# Patient Record
Sex: Male | Born: 1985 | Race: Black or African American | Hispanic: No | Marital: Single | Smoking: Current every day smoker
Health system: Southern US, Community
[De-identification: ages and names within clinical notes are randomized; demographics above are authoritative.]

---

## 2013-12-04 ENCOUNTER — Emergency Department: Payer: Self-pay | Admitting: Emergency Medicine
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
## 2016-07-09 ENCOUNTER — Emergency Department
Admission: EM | Admit: 2016-07-09 | Discharge: 2016-07-09 | Disposition: A | Discharge status: Home / Self Care | Payer: Self-pay | Attending: Student in an Organized Health Care Education/Training Program | Admitting: Student in an Organized Health Care Education/Training Program

## 2016-07-09 DIAGNOSIS — Y939 Activity, unspecified: Secondary | ICD-10-CM | POA: Insufficient documentation

## 2016-07-09 DIAGNOSIS — S025XXA Fracture of tooth (traumatic), initial encounter for closed fracture: Secondary | ICD-10-CM | POA: Insufficient documentation

## 2016-07-09 DIAGNOSIS — Y929 Unspecified place or not applicable: Secondary | ICD-10-CM | POA: Insufficient documentation

## 2016-07-09 DIAGNOSIS — X58XXXA Exposure to other specified factors, initial encounter: Secondary | ICD-10-CM | POA: Insufficient documentation

## 2016-07-09 DIAGNOSIS — Y999 Unspecified external cause status: Secondary | ICD-10-CM | POA: Insufficient documentation

## 2016-07-09 DIAGNOSIS — F17200 Nicotine dependence, unspecified, uncomplicated: Secondary | ICD-10-CM | POA: Insufficient documentation

## 2016-07-09 MED ORDER — TRAMADOL HCL 50 MG PO TABS: 50.0000 mg | ORAL_TABLET | Freq: Four times a day (QID) | ORAL | 0 refills | Status: DC | PRN

## 2016-07-09 NOTE — ED Notes (Signed)
Pt ambulatory to room, talking in complete sentences. States lower R dental pain x 2 days. Has not seen dentist, doesn't have dentist.

## 2016-07-09 NOTE — Discharge Instructions (Signed)
Advised to follow-up with the walk-in dental clinic tomorrow morning.

## 2016-07-09 NOTE — ED Provider Notes (Signed)
[facility] Emergency Department Provider Note   ____________________________________________   First MD Initiated Contact with Patient 07/09/16 1634     (approximate)  I have reviewed the triage vital signs and the nursing notes.   HISTORY  Chief Complaint Dental Pain    HPI Matthew Elliott is a 31 y.o. male  Patient complaining of dental pain secondary to fractured tooth. Patient rates his pain as a 10 over 10. Patient describes the pain as "sharp". No palliative measures taken for complaint.   History reviewed. No pertinent past medical history.  There are no active problems to display for this patient.   History reviewed. No pertinent surgical history.  Prior to Admission medications   Medication Sig Start Date End Date Taking? Authorizing Provider  traMADol (ULTRAM) 50 MG tablet Take 1 tablet (50 mg total) by mouth every 6 (six) hours as needed for moderate pain. 07/09/16   Joni ReiningSmith, Brenyn Petrey K, PA-C    Allergies Patient has no known allergies.  History reviewed. No pertinent family history.  Social History Social History  Substance Use Topics  . Smoking status: Current Every Day Smoker  . Smokeless tobacco: Not on file  . Alcohol use Yes    Review of Systems  Constitutional: No fever/chills Eyes: No visual changes. ENT: No sore throat. Cardiovascular: Denies chest pain. Respiratory: Denies shortness of breath. Gastrointestinal: No abdominal pain.  No nausea, no vomiting.  No diarrhea.  No constipation. Genitourinary: Negative for dysuria. Musculoskeletal: Negative for back pain. Skin: Negative for rash. Neurological: Negative for headaches, focal weakness or numbness.   ____________________________________________   PHYSICAL EXAM:  VITAL SIGNS: ED Triage Vitals  Enc Vitals Group     BP 07/09/16 1631 92/76     Pulse Rate 07/09/16 1631 (!) 58     Resp 07/09/16 1631 18     Temp 07/09/16 1631 97.7 F (36.5 C)     Temp  Source 07/09/16 1631 Oral     SpO2 07/09/16 1631 100 %     Weight 07/09/16 1632 135 lb (61.2 kg)     Height 07/09/16 1632 5\' 8"  (1.727 m)     Head Circumference --      Peak Flow --      Pain Score 07/09/16 1632 10     Pain Loc --      Pain Edu? --      Excl. in [place]? --     Constitutional: Alert and oriented. Well appearing and in no acute distress. Head: Atraumatic. Nose: No congestion/rhinnorhea. Mouth/Throat: Mucous membranes are moist.  Oropharynx non-erythematous. Fractured tooth #29 Neck: No stridor.  No cervical spine tenderness to palpation. Hematological/Lymphatic/Immunilogical: No cervical lymphadenopathy. Cardiovascular: Normal rate, regular rhythm. Grossly normal heart sounds.  Good peripheral circulation. Respiratory: Normal respiratory effort.  No retractions. Lungs CTAB. Neurologic:  Normal speech and language. No gross focal neurologic deficits are appreciated. No gait instability. Skin:  Skin is warm, dry and intact. No rash noted. Psychiatric: Mood and affect are normal. Speech and behavior are normal.  ____________________________________________   LABS (all labs ordered are listed, but only abnormal results are displayed)  Labs Reviewed - No data to display ____________________________________________  EKG   ____________________________________________  RADIOLOGY  No results found.  ____________________________________________   PROCEDURES  Procedure(s) performed: None  Procedures  Critical Care performed: No  ____________________________________________   INITIAL IMPRESSION / ASSESSMENT AND PLAN / ED COURSE  Pertinent labs & imaging results that were available during my care of the  patient were reviewed by me and considered in my medical decision making (see chart for details).   dental pain secondary to fractured tooth. Patient given discharge care instructions. Patient advised of the walk-in dental clinic tomorrow morning.       ____________________________________________   FINAL CLINICAL IMPRESSION(S) / ED DIAGNOSES  Final diagnoses:  Closed fracture of tooth, initial encounter      NEW MEDICATIONS STARTED DURING THIS VISIT:  New Prescriptions   TRAMADOL (ULTRAM) 50 MG TABLET    Take 1 tablet (50 mg total) by mouth every 6 (six) hours as needed for moderate pain.     Note:  This document was prepared using Dragon voice recognition software and may include unintentional dictation errors.    Joni Reining, PA-C 07/09/16 1652    Willy Eddy, MD 07/09/16 2102

## 2018-11-01 ENCOUNTER — Other Ambulatory Visit: Payer: Self-pay

## 2018-11-01 ENCOUNTER — Emergency Department
Admission: EM | Admit: 2018-11-01 | Discharge: 2018-11-01 | Disposition: A | Discharge status: Home / Self Care | Payer: Self-pay | Attending: Emergency Medicine | Admitting: Emergency Medicine

## 2018-11-01 ENCOUNTER — Encounter: Payer: Self-pay | Admitting: Emergency Medicine

## 2018-11-01 DIAGNOSIS — S025XXA Fracture of tooth (traumatic), initial encounter for closed fracture: Secondary | ICD-10-CM | POA: Insufficient documentation

## 2018-11-01 DIAGNOSIS — F17210 Nicotine dependence, cigarettes, uncomplicated: Secondary | ICD-10-CM | POA: Insufficient documentation

## 2018-11-01 DIAGNOSIS — Y929 Unspecified place or not applicable: Secondary | ICD-10-CM | POA: Insufficient documentation

## 2018-11-01 DIAGNOSIS — X58XXXA Exposure to other specified factors, initial encounter: Secondary | ICD-10-CM | POA: Insufficient documentation

## 2018-11-01 DIAGNOSIS — Y939 Activity, unspecified: Secondary | ICD-10-CM | POA: Insufficient documentation

## 2018-11-01 DIAGNOSIS — L03211 Cellulitis of face: Secondary | ICD-10-CM | POA: Insufficient documentation

## 2018-11-01 DIAGNOSIS — Y998 Other external cause status: Secondary | ICD-10-CM | POA: Insufficient documentation

## 2018-11-01 MED ORDER — PREDNISONE 20 MG PO TABS
60.0000 mg | ORAL_TABLET | ORAL | Status: AC
  Administered 2018-11-01: 60 mg via ORAL
  Filled 2018-11-01: qty 3

## 2018-11-01 MED ORDER — PREDNISONE 10 MG PO TABS: ORAL_TABLET | ORAL | 0 refills | Status: AC

## 2018-11-01 MED ORDER — IBUPROFEN 600 MG PO TABS
600.0000 mg | ORAL_TABLET | Freq: Once | ORAL | Status: AC
  Administered 2018-11-01: 600 mg via ORAL
  Filled 2018-11-01: qty 1

## 2018-11-01 MED ORDER — PREDNISONE 10 MG PO TABS: ORAL_TABLET | ORAL | 0 refills | Status: DC

## 2018-11-01 MED ORDER — CLINDAMYCIN HCL 150 MG PO CAPS: 450.0000 mg | ORAL_CAPSULE | Freq: Three times a day (TID) | ORAL | 0 refills | Status: AC

## 2018-11-01 MED ORDER — CLINDAMYCIN HCL 150 MG PO CAPS
450.0000 mg | ORAL_CAPSULE | Freq: Once | ORAL | Status: AC
  Administered 2018-11-01: 450 mg via ORAL
  Filled 2018-11-01: qty 3

## 2018-11-01 MED ORDER — HYDROCODONE-ACETAMINOPHEN 5-325 MG PO TABS: 2.0000 | ORAL_TABLET | Freq: Four times a day (QID) | ORAL | 0 refills | Status: AC | PRN

## 2018-11-01 NOTE — ED Triage Notes (Signed)
Pt says he's had a broken tooth on the top right side of his mouth for a few months; started noticing some swelling to the cheek area 2 days ago; pt in no acute distress

## 2018-11-01 NOTE — ED Notes (Signed)
ED Provider at bedside. 

## 2018-11-01 NOTE — Discharge Instructions (Addendum)
As we discussed, it appears that your dental problems have led to an infection in the left side of your face called facial cellulitis.  It is very important that you take the full course of treatment prescribed including of the antibiotics and the steroids.  The antibiotics will help treat the infection and the steroids will help with the swelling and inflammation.  Take over-the-counter ibuprofen and Tylenol according to label instructions, or try taking ibuprofen 603 times a day with meals.  The steroids and the ibuprofen together can cause some stomach problems, so I encourage you to take it with food, and you may want to consider taking an over-the-counter medication called omeprazole (Prilosec) to help protect your stomach  while you are on the medications.  Your pharmacist can point you in the right direction.  Please call the dentist of your choice at the next available opportunity to schedule a follow-up appointment.  Additionally, for the swelling in your face, I provided the name and number of an ENT (ear, nose, and throat) specialist.  You can call and schedule follow-up appointment with him as well.    Return to the emergency department if you develop new or worsening symptoms that concern you.

## 2018-11-01 NOTE — ED Provider Notes (Signed)
[facility] Emergency Department Provider Note  ____________________________________________   First MD Initiated Contact with Patient 11/01/18 0155     (approximate)  I have reviewed the triage vital signs and the nursing notes.   HISTORY  Chief Complaint Dental Pain    HPI Matthew Elliott is a 33 y.o. male with prior history of broken tooth in the upper left side of his mouth who presents for evaluation of worsening pain over the last 2 to 3 days with swelling in the left side of his face.  He says that he has had a broken tooth for a while and has not had much trouble with it but is become more painful to eat and drink and now the left side of his face is swollen.  It is tender to the touch.  He denies any other symptoms including fever/chills, sore throat, difficulty swallowing, chest pain, shortness of breath, nausea, vomiting, and abdominal pain.  It is not difficult for him to speak or tolerate his own secretions.  The pain is anywhere from mild to severe at times it hurts worse when eating or drinking especially anything cold or hot.  He has no dentist at this time.         History reviewed. No pertinent past medical history.  There are no active problems to display for this patient.   History reviewed. No pertinent surgical history.  Prior to Admission medications   Medication Sig Start Date End Date Taking? Authorizing Provider  clindamycin (CLEOCIN) 150 MG capsule Take 3 capsules (450 mg total) by mouth 3 (three) times daily for 10 days. 11/01/18 11/11/18  Loleta RoseForbach, Tu Shimmel, MD  HYDROcodone-acetaminophen (NORCO/VICODIN) 5-325 MG tablet Take 2 tablets by mouth every 6 (six) hours as needed for moderate pain or severe pain. 11/01/18   Loleta RoseForbach, Rhianon Zabawa, MD  predniSONE (DELTASONE) 10 MG tablet Take 6 tabs (60 mg) PO x 3 days, then take 4 tabs (40 mg) PO x 3 days, then take 2 tabs (20 mg) PO x 3 days, then take 1 tab (10 mg) PO x 3 days, then take 1/2 tab (5  mg) PO x 4 days. 11/01/18   Loleta RoseForbach, Hoyt Leanos, MD    Allergies Patient has no known allergies.  History reviewed. No pertinent family history.  Social History Social History   Tobacco Use  . Smoking status: Current Every Day Smoker    Packs/day: 0.50    Types: Cigarettes  . Smokeless tobacco: Never Used  Substance Use Topics  . Alcohol use: Yes  . Drug use: Not Currently    Review of Systems Constitutional: No fever/chills Eyes: No visual changes. ENT: Left upper dental pain with left-sided facial swelling and pain. Cardiovascular: Denies chest pain. Respiratory: Denies shortness of breath. Gastrointestinal: No abdominal pain.  No nausea, no vomiting.   Musculoskeletal: Negative for neck pain.  Negative for back pain. Neurological: Negative for headaches, focal weakness or numbness.   ____________________________________________   PHYSICAL EXAM:  VITAL SIGNS: ED Triage Vitals  Enc Vitals Group     BP 11/01/18 0120 137/86     Pulse Rate 11/01/18 0120 64     Resp 11/01/18 0120 16     Temp 11/01/18 0120 98.5 F (36.9 C)     Temp Source 11/01/18 0120 Oral     SpO2 11/01/18 0120 99 %     Weight 11/01/18 0122 59 kg (130 lb)     Height 11/01/18 0122 1.727 m (5\' 8" )  Head Circumference --      Peak Flow --      Pain Score 11/01/18 0121 9     Pain Loc --      Pain Edu? --      Excl. in [place]? --     Constitutional: Alert and oriented.  Well-appearing, no acute distress. Eyes: Conjunctivae are normal.  No periorbital swelling. Mouth/Throat: Mucous membranes are moist.  The patient has poor dentition throughout and is difficult to identify specifically which tooth in the left upper part of his mouth is the specific problem.  He has no obvious swelling around the teeth or gums and there are multiple places that he indicates are tender.  There is no induration under his tongue, no intraoral or pharyngeal swelling, no concern for peritonsillar abscess or Ludwig's angina.  The  left side of the patient's cheek is mildly swollen and indurated without any fluctuance, no erythema, no focus of infection that would suggest an abscess. Neck: No stridor.  No meningeal signs.  No submandibular tenderness or induration. Cardiovascular: Normal rate, regular rhythm. Good peripheral circulation. Grossly normal heart sounds. Respiratory: Normal respiratory effort.  No retractions. Skin:  Skin is warm, dry and intact. Psychiatric: Mood and affect are normal. Speech and behavior are normal.  ____________________________________________   LABS (all labs ordered are listed, but only abnormal results are displayed)  Labs Reviewed - No data to display ____________________________________________  EKG  No indication for EKG ____________________________________________  RADIOLOGY I, Hinda Kehr, personally viewed and evaluated these images (plain radiographs) as part of my medical decision making, as well as reviewing the written report by the radiologist.  ED MD interpretation: No indication for imaging tonight  Official radiology report(s): No results found.  ____________________________________________   PROCEDURES   Procedure(s) performed (including Critical Care):  Procedures   ____________________________________________   INITIAL IMPRESSION / MDM / [place] / ED COURSE  As part of my medical decision making, I reviewed the following data within the [facility] notes reviewed and incorporated, Notes from prior ED visits and [place] Controlled Substance Database   Patient's presentation is consistent with an odontogenic left-sided facial cellulitis.  No systemic symptoms and vital signs are normal.  Physical exam is generally reassuring.  I do not think he would benefit from a CT scan of his face tonight and I believe that the presentation is still early and should be treated with oral medications.  I am starting him on  clindamycin and prednisone including a dose of clindamycin 450 mg p.o. and prednisone 60 mg p.o. tonight.  I am putting him on an extended course of clindamycin 450 mg p.o. 3 times daily as well as a prednisone taper which should help with the pain and swelling.  I gave him follow-up information with ENT for the facial cellulitis and strongly encouraged him to go to a dentist to get the source of the issue and provide the dental resource guide.  I gave strict return precautions and he understands and agrees with the plan.       ____________________________________________  FINAL CLINICAL IMPRESSION(S) / ED DIAGNOSES  Final diagnoses:  Closed fracture of tooth, initial encounter  Facial cellulitis     MEDICATIONS GIVEN DURING THIS VISIT:  Medications  clindamycin (CLEOCIN) capsule 450 mg (has no administration in time range)  predniSONE (DELTASONE) tablet 60 mg (has no administration in time range)  ibuprofen (ADVIL) tablet 600 mg (has no administration in time range)  ED Discharge Orders         Ordered    clindamycin (CLEOCIN) 150 MG capsule  3 times daily     11/01/18 0231    predniSONE (DELTASONE) 10 MG tablet  Status:  Discontinued     11/01/18 0231    HYDROcodone-acetaminophen (NORCO/VICODIN) 5-325 MG tablet  Every 6 hours PRN     11/01/18 0231    predniSONE (DELTASONE) 10 MG tablet     11/01/18 0236          *Please note:  Matthew Elliott was evaluated in Emergency Department on 11/01/2018 for the symptoms described in the history of present illness. He was evaluated in the context of the global COVID-19 pandemic, which necessitated consideration that the patient might be at risk for infection with the SARS-CoV-2 virus that causes COVID-19. Institutional protocols and algorithms that pertain to the evaluation of patients at risk for COVID-19 are in a state of rapid change based on information released by regulatory bodies including the CDC and federal and state  organizations. These policies and algorithms were followed during the patient's care in the ED.  Some ED evaluations and interventions may be delayed as a result of limited staffing during the pandemic.*  Note:  This document was prepared using Dragon voice recognition software and may include unintentional dictation errors.   Loleta Rose, MD 11/01/18 [phone]

## 2018-11-01 NOTE — ED Notes (Signed)
Patient reports left upper dental pain for 2 days.  Slight swelling noted to left upper jaw.

## 2019-01-06 ENCOUNTER — Emergency Department
Admission: EM | Admit: 2019-01-06 | Discharge: 2019-01-06 | Disposition: A | Discharge status: Home / Self Care | Payer: Self-pay | Attending: Emergency Medicine | Admitting: Emergency Medicine

## 2019-01-06 ENCOUNTER — Other Ambulatory Visit: Payer: Self-pay

## 2019-01-06 ENCOUNTER — Encounter: Payer: Self-pay | Admitting: *Deleted

## 2019-01-06 DIAGNOSIS — L03211 Cellulitis of face: Secondary | ICD-10-CM | POA: Insufficient documentation

## 2019-01-06 DIAGNOSIS — F17210 Nicotine dependence, cigarettes, uncomplicated: Secondary | ICD-10-CM | POA: Insufficient documentation

## 2019-01-06 DIAGNOSIS — K029 Dental caries, unspecified: Secondary | ICD-10-CM | POA: Insufficient documentation

## 2019-01-06 MED ORDER — IBUPROFEN 800 MG PO TABS: 800.0000 mg | ORAL_TABLET | Freq: Three times a day (TID) | ORAL | 0 refills | Status: AC | PRN

## 2019-01-06 MED ORDER — LIDOCAINE VISCOUS HCL 2 % MT SOLN
15.0000 mL | Freq: Once | OROMUCOSAL | Status: AC
  Administered 2019-01-06: 15 mL via OROMUCOSAL
  Filled 2019-01-06: qty 15

## 2019-01-06 MED ORDER — CLINDAMYCIN HCL 150 MG PO CAPS
300.0000 mg | ORAL_CAPSULE | Freq: Once | ORAL | Status: AC
  Administered 2019-01-06: 300 mg via ORAL
  Filled 2019-01-06: qty 2

## 2019-01-06 MED ORDER — LIDOCAINE VISCOUS HCL 2 % MT SOLN: 15.0000 mL | OROMUCOSAL | 0 refills | Status: AC | PRN

## 2019-01-06 MED ORDER — CLINDAMYCIN HCL 300 MG PO CAPS: 300.0000 mg | ORAL_CAPSULE | Freq: Three times a day (TID) | ORAL | 0 refills | Status: AC

## 2019-01-06 MED ORDER — IBUPROFEN 600 MG PO TABS
600.0000 mg | ORAL_TABLET | Freq: Once | ORAL | Status: AC
  Administered 2019-01-06: 600 mg via ORAL
  Filled 2019-01-06: qty 1

## 2019-01-06 NOTE — Discharge Instructions (Addendum)
OPTIONS FOR DENTAL FOLLOW UP CARE ° °[place] [facility] and [facility] - Local Safety Net Dental Clinics °http://www.ncdhhs.gov/dph/oralhealth/services/safetynetclinics.htm °  °[facility] (336-562-3123) ° °[facility] (919-933-9087) ° °[facility] ([phone] ext [phone]) ° °[facility] ([phone]) ° °[facility] ([phone]) °This clinic caters to the indigent population and is on a lottery system. °Location: °[facility], [address], [place] °Clinic Hours: °Wednesdays from 6pm - 9pm, patients seen by a lottery system. °For dates, call or go to [email or web address].[facility].edu/[facility]/patients/Dental-[facility] °Services: °Cleanings, fillings and simple extractions. °Payment Options: °DENTAL WORK IS FREE OF CHARGE. Bring proof of income or support. °Best way to get seen: °Arrive at 5:15 pm - this is a lottery, NOT first come/first serve, so arriving earlier will not increase your chances of being seen. °  °  °[facility] °[phone] °Select option 1 for emergencies °  °Location: °[facility], [address], [place] °Clinic Hours: °No walk-ins accepted - call the day before to schedule an appointment. °Check in times are 9:30 am and 1:30 pm. °Services: °Simple extractions, temporary fillings, pulpectomy/pulp debridement, uncomplicated abscess drainage. °Payment Options: °PAYMENT IS DUE AT THE TIME OF SERVICE.  Fee is usually $100-200, additional surgical procedures (e.g. abscess drainage) may be extra. °Cash, checks, Visa/MasterCard accepted.  Can file Medicaid if patient is covered for dental - patient should call case worker to check. °No discount for [facility] patients. °Best way to get seen: °MUST call the day before and get onto the schedule. Can usually be seen the next 1-2 days. No walk-ins accepted. °  °  °[facility] °[phone] °   °Location: °[facility], [address], [place] °Clinic Hours: °M, W, Th, F 8am or 1:30pm, Tues 9a or 1:30 - first come/first served. °Services: °Simple extractions, temporary fillings, uncomplicated abscess drainage.  You do not need to be an [place] resident. °Payment Options: °PAYMENT IS DUE AT THE TIME OF SERVICE. °Dental insurance, otherwise sliding scale - bring proof of income or support. °Depending on income and treatment needed, cost is usually $50-200. °Best way to get seen: °Arrive early as it is first come/first served. °  °  °[facility] °[phone] °  °Location: °[address] °Clinic Hours: °Mon-Thu 8a-5p °Services: °Most basic dental services including extractions and fillings. °Payment Options: °PAYMENT IS DUE AT THE TIME OF SERVICE. °Sliding scale, up to 50% off - bring proof if income or support. °Medicaid with dental option accepted. °Best way to get seen: °Call to schedule an appointment, can usually be seen within 2 weeks OR they will try to see walk-ins - show up at 8a or 2p (you may have to wait). °  °  °[facility] °919-245-2435 °[place] RESIDENTS ONLY °  °Location: °[facility], [address], [place], [place] [address] °Clinic Hours: By appointment only. °Monday - Thursday 8am-5pm, Friday 8am-12pm °Services: Cleanings, fillings, extractions. °Payment Options: °PAYMENT IS DUE AT THE TIME OF SERVICE. °Cash, Visa or MasterCard. Sliding scale - $30 minimum per service. °Best way to get seen: °Come in to office, complete packet and make an appointment - need proof of income °or support monies for each household member and proof of [place] residence. °Usually takes about a month to get in. °  °  °[facility] °[phone] °  °Location: °[address]   [place] °Clinic Hours: Walk-in Urgent Care Dental Services are offered Monday-Friday  mornings only. °The numbers of emergencies accepted daily is limited to the number of °providers available. °Maximum 15 - Mondays, Wednesdays & Thursdays °Maximum 10 - Tuesdays & Fridays °Services: °You do not need to be a  [place] resident to be seen for a dental emergency. °Emergencies are defined as pain, swelling, abnormal bleeding, or dental trauma. Walkins will receive x-rays if needed. °NOTE: Dental cleaning is not an emergency. °Payment Options: °PAYMENT IS DUE AT THE TIME OF SERVICE. °Minimum co-pay is $40.00 for uninsured patients. °Minimum co-pay is $3.00 for Medicaid with dental coverage. °Dental Insurance is accepted and must be presented at time of visit. °Medicare does not cover dental. °Forms of payment: Cash, credit card, checks. °Best way to get seen: °If not previously registered with the clinic, walk-in dental registration begins at 7:15 am and is on a first come/first serve basis. °If previously registered with the clinic, call to make an appointment. °  °  °[facility] °[phone] °[place] RESIDENTS ONLY °  °Location: °[address], [place], [place] °Clinic Hours: °Mon-Thu 10a-2p °Services: Extractions only! °Payment Options: °FREE (donations accepted) - bring proof of income or support °Best way to get seen: °Call and schedule an appointment OR come at 8am on the 1st Monday of every month (except for holidays) when it is first come/first served. °  °  °[facility] °[phone] °  °Location: °[address], [place] °Clinic Hours: °Friday mornings °Services, Payment Options, Best way to get seen: °Call for info °

## 2019-01-06 NOTE — ED Provider Notes (Signed)
[facility] Emergency Department Provider Note  ____________________________________________  Time seen: Approximately 1:12 AM  I have reviewed the triage vital signs and the nursing notes.   HISTORY  Chief Complaint Dental Pain   HPI Matthew Elliott is a 33 y.o. male no significant past medical history who presents for evaluation of dental pain.  Patient was seen here exactly 2 months ago for the same.  Was discharged home on clindamycin and prednisone I recommended follow-up with dentist for extraction.  Patient symptoms improved on the medication and he never followed up with a dentist.  Over the last 2 days he has had new onset of pain with swelling of the left cheek.  The pain is on the left upper molar.  No fever or chills, no trismus, no difficulty swallowing or breathing, no nausea or vomiting.   PMH None - reviewed  Prior to Admission medications   Medication Sig Start Date End Date Taking? Authorizing Provider  clindamycin (CLEOCIN) 300 MG capsule Take 1 capsule (300 mg total) by mouth 3 (three) times daily for 10 days. 01/06/19 01/16/19  Nita Sickle, MD  HYDROcodone-acetaminophen (NORCO/VICODIN) 5-325 MG tablet Take 2 tablets by mouth every 6 (six) hours as needed for moderate pain or severe pain. 11/01/18   Loleta Rose, MD  ibuprofen (ADVIL) 800 MG tablet Take 1 tablet (800 mg total) by mouth every 8 (eight) hours as needed. 01/06/19   Don Perking, [place], MD  lidocaine (XYLOCAINE) 2 % solution Use as directed 15 mLs in the mouth or throat as needed for mouth pain. 01/06/19   Nita Sickle, MD  predniSONE (DELTASONE) 10 MG tablet Take 6 tabs (60 mg) PO x 3 days, then take 4 tabs (40 mg) PO x 3 days, then take 2 tabs (20 mg) PO x 3 days, then take 1 tab (10 mg) PO x 3 days, then take 1/2 tab (5 mg) PO x 4 days. 11/01/18   Loleta Rose, MD    Allergies Patient has no known allergies.  No family history on file.  Social History Social  History   Tobacco Use  . Smoking status: Current Every Day Smoker    Packs/day: 0.50    Types: Cigarettes  . Smokeless tobacco: Never Used  Substance Use Topics  . Alcohol use: Yes  . Drug use: Not Currently    Review of Systems  Constitutional: Negative for fever. Eyes: Negative for visual changes. ENT: Negative for sore throat. + dental pain Neck: No neck pain  Cardiovascular: Negative for chest pain. Respiratory: Negative for shortness of breath. Gastrointestinal: Negative for abdominal pain, vomiting or diarrhea. Genitourinary: Negative for dysuria. Musculoskeletal: Negative for back pain. Skin: Negative for rash. Neurological: Negative for headaches, weakness or numbness. Psych: No SI or HI  ____________________________________________   PHYSICAL EXAM:  VITAL SIGNS: ED Triage Vitals [01/06/19 0008]  Enc Vitals Group     BP 133/74     Pulse Rate 89     Resp 18     Temp 98.5 F (36.9 C)     Temp Source Oral     SpO2 99 %     Weight 140 lb (63.5 kg)     Height 5\' 8"  (1.727 m)     Head Circumference      Peak Flow      Pain Score 9     Pain Loc      Pain Edu?      Excl. in GC?     Constitutional:  Alert and oriented. Well appearing and in no apparent distress. HEENT:      Head: Normocephalic and atraumatic.         Eyes: Conjunctivae are normal. Sclera is non-icteric.       Mouth/Throat: Mucous membranes are moist.  Poor dentition with several cavities, patient indicates his second molar at the site of pain where there is a large cavity.  There is no swelling of the gums or any evidence of abscess.  There is mild facial swelling with no palpable fluctuance or erythema that would suggest an abscess.  There is no trismus, floor of the mouth is soft with no evidence of Ludewig's angina.  Tonsils are normal with no peritonsillar abscess.      Neck: Supple with no signs of meningismus. Cardiovascular: Regular rate and rhythm.  Respiratory: Normal respiratory  effort.  Neurologic: Normal speech and language. Face is symmetric. Moving all extremities. No gross focal neurologic deficits are appreciated. Skin: Skin is warm, dry and intact. No rash noted. Psychiatric: Mood and affect are normal. Speech and behavior are normal.  ____________________________________________   LABS (all labs ordered are listed, but only abnormal results are displayed)  Labs Reviewed - No data to display ____________________________________________  EKG  none  ____________________________________________  RADIOLOGY  none  ____________________________________________   PROCEDURES  Procedure(s) performed: None Procedures Critical Care performed:  None ____________________________________________   INITIAL IMPRESSION / ASSESSMENT AND PLAN / ED COURSE   33 y.o. male no significant past medical history who presents for evaluation of dental pain.  Patient report dentition, several cavities, no evidence of abscess or Ludewig's angina.  He does have mild facial cellulitis in the left upper cheek.  No systemic signs or symptoms.  Discussed with the patient importance of following up with his dentist.  This is the second visit to the emergency room in 2 months for the same complaint.  I explained to the patient that without pulling the tooth infection will be very hard to control.  In the meantime we will put him back on clindamycin which was previously with good results.  Provided the patient with a list of dental providers in the area.  Discussed return precautions for any signs of abscess, trismus, Ludewig's angina, or systemic signs of infection.       As part of my medical decision making, I reviewed the following data within the [place] notes reviewed and incorporated, Old chart reviewed, Notes from prior ED visits and [place] Controlled Substance Database   Please note:  Patient was evaluated in Emergency Department today for the  symptoms described in the history of present illness. Patient was evaluated in the context of the global COVID-19 pandemic, which necessitated consideration that the patient might be at risk for infection with the SARS-CoV-2 virus that causes COVID-19. Institutional protocols and algorithms that pertain to the evaluation of patients at risk for COVID-19 are in a state of rapid change based on information released by regulatory bodies including the CDC and federal and state organizations. These policies and algorithms were followed during the patient's care in the ED.  Some ED evaluations and interventions may be delayed as a result of limited staffing during the pandemic.   ____________________________________________   FINAL CLINICAL IMPRESSION(S) / ED DIAGNOSES   Final diagnoses:  Dental caries  Facial cellulitis      NEW MEDICATIONS STARTED DURING THIS VISIT:  ED Discharge Orders         Ordered  clindamycin (CLEOCIN) 300 MG capsule  3 times daily     01/06/19 0111    ibuprofen (ADVIL) 800 MG tablet  Every 8 hours PRN     01/06/19 0111    lidocaine (XYLOCAINE) 2 % solution  As needed     01/06/19 0111           Note:  This document was prepared using Dragon voice recognition software and may include unintentional dictation errors.    Nita SickleVeronese, [place], MD 01/06/19 [phone]

## 2019-01-06 NOTE — ED Triage Notes (Signed)
Pt has pain in left upper gum area.  Sx for 2 days.  Pt reports abscess in mouth.  Pt alert.
